# Patient Record
Sex: Female | Born: 1950 | Race: Black or African American | Hispanic: No | Marital: Married | Smoking: Never smoker
Health system: Southern US, Community
[De-identification: ages and names within clinical notes are randomized; demographics above are authoritative.]

## PROBLEM LIST (undated history)

## (undated) DIAGNOSIS — E119 Type 2 diabetes mellitus without complications: Secondary | ICD-10-CM

## (undated) DIAGNOSIS — J302 Other seasonal allergic rhinitis: Secondary | ICD-10-CM

## (undated) HISTORY — PX: TUBAL LIGATION: SHX77

## (undated) HISTORY — DX: Type 2 diabetes mellitus without complications: E11.9

---

## 2005-07-21 ENCOUNTER — Emergency Department: Payer: Self-pay | Admitting: Emergency Medicine

## 2005-07-22 ENCOUNTER — Other Ambulatory Visit: Payer: Self-pay

## 2005-09-11 ENCOUNTER — Ambulatory Visit: Payer: Self-pay | Admitting: Family Medicine

## 2006-01-07 ENCOUNTER — Ambulatory Visit: Payer: Self-pay | Admitting: Unknown Physician Specialty

## 2006-10-22 ENCOUNTER — Ambulatory Visit: Payer: Self-pay | Admitting: Family Medicine

## 2007-07-12 ENCOUNTER — Ambulatory Visit: Payer: Self-pay | Admitting: Family Medicine

## 2007-11-22 ENCOUNTER — Ambulatory Visit: Payer: Self-pay | Admitting: Family Medicine

## 2008-12-25 ENCOUNTER — Ambulatory Visit: Payer: Self-pay | Admitting: Family Medicine

## 2010-02-28 ENCOUNTER — Ambulatory Visit: Payer: Self-pay | Admitting: Family Medicine

## 2013-07-04 ENCOUNTER — Ambulatory Visit: Payer: Self-pay | Admitting: Nephrology

## 2013-08-23 DIAGNOSIS — N183 Chronic kidney disease, stage 3 unspecified: Secondary | ICD-10-CM | POA: Insufficient documentation

## 2013-08-23 DIAGNOSIS — I10 Essential (primary) hypertension: Secondary | ICD-10-CM | POA: Insufficient documentation

## 2013-08-23 DIAGNOSIS — E785 Hyperlipidemia, unspecified: Secondary | ICD-10-CM | POA: Insufficient documentation

## 2013-08-23 DIAGNOSIS — M109 Gout, unspecified: Secondary | ICD-10-CM | POA: Insufficient documentation

## 2013-08-23 DIAGNOSIS — E1122 Type 2 diabetes mellitus with diabetic chronic kidney disease: Secondary | ICD-10-CM | POA: Insufficient documentation

## 2015-02-08 DIAGNOSIS — R42 Dizziness and giddiness: Secondary | ICD-10-CM | POA: Insufficient documentation

## 2015-02-08 DIAGNOSIS — H9319 Tinnitus, unspecified ear: Secondary | ICD-10-CM | POA: Insufficient documentation

## 2015-02-08 DIAGNOSIS — M67441 Ganglion, right hand: Secondary | ICD-10-CM | POA: Insufficient documentation

## 2017-02-05 ENCOUNTER — Other Ambulatory Visit: Payer: Self-pay | Admitting: Family Medicine

## 2017-02-05 DIAGNOSIS — I739 Peripheral vascular disease, unspecified: Secondary | ICD-10-CM

## 2017-02-10 ENCOUNTER — Ambulatory Visit
Admission: RE | Admit: 2017-02-10 | Discharge: 2017-02-10 | Disposition: A | Discharge status: Home / Self Care | Payer: Medicare Other | Source: Ambulatory Visit | Attending: Family Medicine | Admitting: Family Medicine

## 2017-02-10 DIAGNOSIS — I739 Peripheral vascular disease, unspecified: Secondary | ICD-10-CM | POA: Diagnosis not present

## 2017-11-18 ENCOUNTER — Encounter (INDEPENDENT_AMBULATORY_CARE_PROVIDER_SITE_OTHER): Payer: Self-pay

## 2017-11-18 ENCOUNTER — Ambulatory Visit: Payer: Medicare Other | Admitting: Gastroenterology

## 2017-11-18 ENCOUNTER — Other Ambulatory Visit: Payer: Self-pay

## 2017-11-18 ENCOUNTER — Encounter: Payer: Self-pay | Admitting: Gastroenterology

## 2017-11-18 VITALS — BP 153/77 | HR 72 | Ht 66.0 in | Wt 188.0 lb

## 2017-11-18 DIAGNOSIS — K921 Melena: Secondary | ICD-10-CM

## 2017-11-18 DIAGNOSIS — Z800 Family history of malignant neoplasm of digestive organs: Secondary | ICD-10-CM

## 2017-11-18 NOTE — Progress Notes (Signed)
Gastroenterology Consultation  Referring Provider:     Sofie Hartigan, MD Primary Care Physician:  Madison Hartigan, MD Primary Gastroenterologist:  Dr. Allen Moore     Reason for Consultation:     Hematochezia        HPI:   Madison Moore is a 67 y.o. y/o female referred for consultation & management of hematochezia by Dr. Ellison Moore, Madison Noa, MD.  This patient comes in today after being seen in [facility] for rectal bleeding.  The patient states that she has intermittent bouts of constipation and reports that she had some bright red blood per rectum and went to [facility] ER.  The patient states she has not had any further bleeding since then.  The patient also reports that she has a brother who had colon cancer.  The patient's last colonoscopy was in 2007 and she has not had a repeat since.  There is no report of any unexplained weight loss fevers chills nausea or vomiting.  The patient also denies any change in bowel habits except for intermittent changes when she eats something that does not agree with her. The patient was reassured by her PCP that since it was one episode of bleeding and her blood count was reported to be good that she should wait to see me prior to having any procedures done in [place].  Past Medical History:  Diagnosis Date  . Diabetes [facility])     Past Surgical History:  Procedure Laterality Date  . CESAREAN SECTION    . TUBAL LIGATION      Prior to Admission medications   Medication Sig Start Date End Date Taking? Authorizing Provider  atorvastatin (LIPITOR) 20 MG tablet Take by mouth. 12/04/16 12/04/17 Yes [provider]  colchicine 0.6 MG tablet Take 2 tablets (1.2mg ) by mouth at first sign of gout flare followed by 1 tablet (0.6mg ) after 1 hour. (Max 1.8mg  within 1 hour) 11/24/16  Yes [provider]  fluticasone (FLONASE) 50 MCG/ACT nasal spray Place into the nose. 07/12/14  Yes [provider]  meclizine (ANTIVERT) 25 MG tablet   01/13/15  Yes [provider]  pioglitazone (ACTOS) 30 MG tablet TK 1 T PO ONCE D 08/21/17  Yes [provider]  valsartan-hydrochlorothiazide (DIOVAN-HCT) 160-25 MG tablet TK 1 T PO ONCE D 08/17/17  Yes [provider]    History reviewed. No pertinent family history.   Social History   Tobacco Use  . Smoking status: Never Smoker  . Smokeless tobacco: Never Used  Substance Use Topics  . Alcohol use: Never    Frequency: Never  . Drug use: Never    Allergies as of 11/18/2017  . (No Known Allergies)    Review of Systems:    All systems reviewed and negative except where noted in HPI.   Physical Exam:  BP (!) 153/77   Pulse 72   Ht 5\' 6"  (1.676 m)   Wt 188 lb (85.3 kg)   BMI 30.34 kg/m  No LMP recorded. General:   Alert,  Well-developed, well-nourished, pleasant and cooperative in NAD Head:  Normocephalic and atraumatic. Eyes:  Sclera clear, no icterus.   Conjunctiva pink. Ears:  Normal auditory acuity. Nose:  No deformity, discharge, or lesions. Mouth:  No deformity or lesions,oropharynx pink & moist. Neck:  Supple; no masses or thyromegaly. Lungs:  Respirations even and unlabored.  Clear throughout to auscultation.   No wheezes, crackles, or rhonchi. No acute distress. Heart:  Regular rate and  rhythm; no murmurs, clicks, rubs, or gallops. Abdomen:  Normal bowel sounds.  No bruits.  Soft, non-tender and non-distended without masses, hepatosplenomegaly or hernias noted.  No guarding or rebound tenderness.  Negative Carnett sign.   Rectal:  Deferred.  Msk:  Symmetrical without gross deformities.  Good, equal movement & strength bilaterally. Pulses:  Normal pulses noted. Extremities:  No clubbing or edema.  No cyanosis. Neurologic:  Alert and oriented x3;  grossly normal neurologically. Skin:  Intact without significant lesions or rashes.  No jaundice. Lymph Nodes:  No significant cervical adenopathy. Psych:  Alert and cooperative. Normal mood and  affect.  Imaging Studies: No results found.  Assessment and Plan:   Madison Moore is a 67 y.o. y/o female who comes in today after being in the ER at [facility] with rectal bleeding.  The patient has not had any further rectal bleeding although she states she does see some bright red blood per rectum when she is constipated.  The patient will be set up for a colonoscopy and has been told that since her brother had colon cancer she should have her colonoscopy every 5 years. I have discussed risks & benefits which include, but are not limited to, bleeding, infection, perforation & drug reaction.  The patient agrees with this plan & written consent will be obtained.     Madison Lame, MD. Marval Regal    Note: This dictation was prepared with Dragon dictation along with smaller phrase technology. Any transcriptional errors that result from this process are unintentional.

## 2017-11-25 NOTE — Discharge Instructions (Signed)
General Anesthesia, Adult, Care After °These instructions provide you with information about caring for yourself after your procedure. Your health care provider may also give you more specific instructions. Your treatment has been planned according to current medical practices, but problems sometimes occur. Call your health care provider if you have any problems or questions after your procedure. °What can I expect after the procedure? °After the procedure, it is common to have: °· Vomiting. °· A sore throat. °· Mental slowness. ° °It is common to feel: °· Nauseous. °· Cold or shivery. °· Sleepy. °· Tired. °· Sore or achy, even in parts of your body where you did not have surgery. ° °Follow these instructions at home: °For at least 24 hours after the procedure: °· Do not: °? Participate in activities where you could fall or become injured. °? Drive. °? Use heavy machinery. °? Drink alcohol. °? Take sleeping pills or medicines that cause drowsiness. °? Make important decisions or sign legal documents. °? Take care of children on your own. °· Rest. °Eating and drinking °· If you vomit, drink water, juice, or soup when you can drink without vomiting. °· Drink enough fluid to keep your urine clear or pale yellow. °· Make sure you have little or no nausea before eating solid foods. °· Follow the diet recommended by your health care provider. °General instructions °· Have a responsible adult stay with you until you are awake and alert. °· Return to your normal activities as told by your health care provider. Ask your health care provider what activities are safe for you. °· Take over-the-counter and prescription medicines only as told by your health care provider. °· If you smoke, do not smoke without supervision. °· Keep all follow-up visits as told by your health care provider. This is important. °Contact a health care provider if: °· You continue to have nausea or vomiting at home, and medicines are not helpful. °· You  cannot drink fluids or start eating again. °· You cannot urinate after 8-12 hours. °· You develop a skin rash. °· You have fever. °· You have increasing redness at the site of your procedure. °Get help right away if: °· You have difficulty breathing. °· You have chest pain. °· You have unexpected bleeding. °· You feel that you are having a life-threatening or urgent problem. °This information is not intended to replace advice given to you by your health care provider. Make sure you discuss any questions you have with your health care provider. °Document Released: 06/02/2000 Document Revised: 07/30/2015 Document Reviewed: 02/08/2015 °Elsevier Interactive Patient Education © 2018 [employer]. ° °

## 2017-11-26 ENCOUNTER — Ambulatory Visit
Admission: RE | Admit: 2017-11-26 | Discharge: 2017-11-26 | Disposition: A | Discharge status: Home / Self Care | Payer: Medicare Other | Source: Ambulatory Visit | Attending: Gastroenterology | Admitting: Gastroenterology

## 2017-11-26 ENCOUNTER — Ambulatory Visit: Payer: Medicare Other | Admitting: Anesthesiology

## 2017-11-26 ENCOUNTER — Encounter
Admission: RE | Disposition: A | Discharge status: Home / Self Care | Payer: Self-pay | Source: Ambulatory Visit | Attending: Gastroenterology

## 2017-11-26 DIAGNOSIS — D125 Benign neoplasm of sigmoid colon: Secondary | ICD-10-CM | POA: Diagnosis not present

## 2017-11-26 DIAGNOSIS — Z7951 Long term (current) use of inhaled steroids: Secondary | ICD-10-CM | POA: Insufficient documentation

## 2017-11-26 DIAGNOSIS — Z1211 Encounter for screening for malignant neoplasm of colon: Secondary | ICD-10-CM | POA: Diagnosis not present

## 2017-11-26 DIAGNOSIS — E119 Type 2 diabetes mellitus without complications: Secondary | ICD-10-CM | POA: Insufficient documentation

## 2017-11-26 DIAGNOSIS — K648 Other hemorrhoids: Secondary | ICD-10-CM | POA: Diagnosis not present

## 2017-11-26 DIAGNOSIS — K635 Polyp of colon: Secondary | ICD-10-CM

## 2017-11-26 DIAGNOSIS — Z800 Family history of malignant neoplasm of digestive organs: Secondary | ICD-10-CM | POA: Diagnosis not present

## 2017-11-26 DIAGNOSIS — I10 Essential (primary) hypertension: Secondary | ICD-10-CM | POA: Diagnosis not present

## 2017-11-26 HISTORY — PX: POLYPECTOMY: SHX5525

## 2017-11-26 HISTORY — DX: Other seasonal allergic rhinitis: J30.2

## 2017-11-26 HISTORY — PX: COLONOSCOPY WITH PROPOFOL: SHX5780

## 2017-11-26 LAB — GLUCOSE, CAPILLARY: GLUCOSE-CAPILLARY: 81 mg/dL (ref 70–99)

## 2017-11-26 SURGERY — COLONOSCOPY WITH PROPOFOL
Anesthesia: General | Site: Rectum

## 2017-11-26 MED ORDER — PROPOFOL 10 MG/ML IV BOLUS
INTRAVENOUS | Status: DC | PRN
  Administered 2017-11-26: 80 mg via INTRAVENOUS
  Administered 2017-11-26 (×4): 20 mg via INTRAVENOUS

## 2017-11-26 MED ORDER — LACTATED RINGERS IV SOLN: 1000.0000 mL | INTRAVENOUS | Status: DC

## 2017-11-26 MED ORDER — LACTATED RINGERS IV SOLN
INTRAVENOUS | Status: DC | PRN
  Administered 2017-11-26: 10:00:00 via INTRAVENOUS

## 2017-11-26 MED ORDER — LIDOCAINE HCL (CARDIAC) PF 100 MG/5ML IV SOSY
PREFILLED_SYRINGE | INTRAVENOUS | Status: DC | PRN
  Administered 2017-11-26: 80 mg via INTRAVENOUS

## 2017-11-26 SURGICAL SUPPLY — 6 items
CANISTER SUCT 1200ML W/VALVE (MISCELLANEOUS) ×3 IMPLANT
FORCEPS BIOP RAD 4 LRG CAP 4 (CUTTING FORCEPS) ×3 IMPLANT
GOWN CVR UNV OPN BCK APRN NK (MISCELLANEOUS) ×2 IMPLANT
GOWN ISOL THUMB LOOP REG UNIV (MISCELLANEOUS) ×4
KIT ENDO PROCEDURE OLY (KITS) ×3 IMPLANT
WATER STERILE IRR 250ML POUR (IV SOLUTION) ×3 IMPLANT

## 2017-11-26 NOTE — Anesthesia Preprocedure Evaluation (Addendum)
Anesthesia Evaluation  Patient identified by MRN, date of birth, ID band Patient awake    Reviewed: Allergy & Precautions, NPO status , Patient's Chart, lab work & pertinent test results, reviewed documented beta blocker date and time   Airway Mallampati: II  TM Distance: >3 FB Neck ROM: Full    Dental no notable dental hx.    Pulmonary neg pulmonary ROS,    Pulmonary exam normal breath sounds clear to auscultation       Cardiovascular hypertension, Normal cardiovascular exam Rhythm:Regular Rate:Normal     Neuro/Psych negative neurological ROS  negative psych ROS   GI/Hepatic Neg liver ROS, BRBPR   Endo/Other  diabetes  Renal/GU CRFRenal disease     Musculoskeletal negative musculoskeletal ROS (+)   Abdominal Normal abdominal exam  (+)   Peds  Hematology negative hematology ROS (+)   Anesthesia Other Findings   Reproductive/Obstetrics                            Anesthesia Physical Anesthesia Plan  ASA: II  Anesthesia Plan: General   Post-op Pain Management:    Induction: Intravenous  PONV Risk Score and Plan:   Airway Management Planned: Natural Airway  Additional Equipment: None  Intra-op Plan:   Post-operative Plan:   Informed Consent: I have reviewed the patients History and Physical, chart, labs and discussed the procedure including the risks, benefits and alternatives for the proposed anesthesia with the patient or authorized representative who has indicated his/her understanding and acceptance.     Plan Discussed with: CRNA, Anesthesiologist and Surgeon  Anesthesia Plan Comments:         Anesthesia Quick Evaluation

## 2017-11-26 NOTE — Transfer of Care (Signed)
Immediate Anesthesia Transfer of Care Note  Patient: Madison Moore  Procedure(s) Performed: COLONOSCOPY WITH PROPOFOL WITH BIOPSY (N/A Rectum) POLYPECTOMY (N/A Rectum)  Patient Location: PACU  Anesthesia Type: General  Level of Consciousness: awake, alert  and patient cooperative  Airway and Oxygen Therapy: Patient Spontanous Breathing and Patient connected to supplemental oxygen  Post-op Assessment: Post-op Vital signs reviewed, Patient's Cardiovascular Status Stable, Respiratory Function Stable, Patent Airway and No signs of Nausea or vomiting  Post-op Vital Signs: Reviewed and stable  Complications: No apparent anesthesia complications

## 2017-11-26 NOTE — Op Note (Addendum)
[facility] Gastroenterology Patient Name: Madison Moore Procedure Date: 11/26/2017 10:24 AM MRN: [id number] Account #: [id number] Date of Birth: Apr 12, 1950 Admit Type: Outpatient Age: 67 Room: [facility] OR ROOM 01 Gender: Female Note Status: Finalized Procedure:            Colonoscopy Indications:          Screening in patient at increased risk: Family history                        of 1st-degree relative with colorectal cancer Providers:            Lucilla Lame MD, MD Referring MD:         Sofie Hartigan (Referring MD) Medicines:            Propofol per Anesthesia Complications:        No immediate complications. Procedure:            Pre-Anesthesia Assessment:                       - Prior to the procedure, a History and Physical was                        performed, and patient medications and allergies were                        reviewed. The patient's tolerance of previous                        anesthesia was also reviewed. The risks and benefits of                        the procedure and the sedation options and risks were                        discussed with the patient. All questions were                        answered, and informed consent was obtained. Prior                        Anticoagulants: The patient has taken no previous                        anticoagulant or antiplatelet agents. ASA Grade                        Assessment: II - A patient with mild systemic disease.                        After reviewing the risks and benefits, the patient was                        deemed in satisfactory condition to undergo the                        procedure.                       After obtaining informed consent, the colonoscope was  passed under direct vision. Throughout the procedure,                        the patient's blood pressure, pulse, and oxygen                        saturations were monitored continuously. The                         Colonoscope was introduced through the anus and                        advanced to the the cecum, identified by appendiceal                        orifice and ileocecal valve. The colonoscopy was                        performed without difficulty. The patient tolerated the                        procedure well. The quality of the bowel preparation                        was excellent. Findings:      The perianal and digital rectal examinations were normal.      A 2 mm polyp was found in the sigmoid colon. The polyp was sessile. The       polyp was removed with a cold biopsy forceps. Resection and retrieval       were complete.      Non-bleeding internal hemorrhoids were found during retroflexion. The       hemorrhoids were Grade II (internal hemorrhoids that prolapse but reduce       spontaneously). Impression:           - One 2 mm polyp in the sigmoid colon, removed with a                        cold biopsy forceps. Resected and retrieved.                       - Non-bleeding internal hemorrhoids. Recommendation:       - Discharge patient to home.                       - Resume previous diet.                       - Continue present medications.                       - Await pathology results.                       - Repeat colonoscopy in 5 years for surveillance. Procedure Code(s):    --- Professional ---                       [phone], Colonoscopy, flexible; with biopsy, single or                        multiple Diagnosis Code(s):    ---  Professional ---                       Z80.0, Family history of malignant neoplasm of                        digestive organs                       D12.5, Benign neoplasm of sigmoid colon CPT copyright 2017 American Medical Association. All rights reserved. The codes documented in this report are preliminary and upon coder review may  be revised to meet current compliance requirements. Lucilla Lame MD, MD 11/26/2017 10:45:05 AM This  report has been signed electronically. Number of Addenda: 0 Note Initiated On: 11/26/2017 10:24 AM Scope Withdrawal Time: 0 hours 8 minutes 59 seconds  Total Procedure Duration: 0 hours 12 minutes 47 seconds       [facility]

## 2017-11-26 NOTE — H&P (Signed)
Lucilla Lame, MD [address] [place], [place] [address] Phone:[phone] Fax : [phone]  Primary Care Physician:  Sofie Hartigan, MD Primary Gastroenterologist:  Dr. Allen Norris  Pre-Procedure History & Physical: HPI:  Madison Moore is a 67 y.o. female is here for an colonoscopy.   Past Medical History:  Diagnosis Date  . Diabetes ([place])   . Seasonal allergies     Past Surgical History:  Procedure Laterality Date  . CESAREAN SECTION    . TUBAL LIGATION      Prior to Admission medications   Medication Sig Start Date End Date Taking? Authorizing Provider  atorvastatin (LIPITOR) 20 MG tablet Take by mouth. 12/04/16 12/04/17 Yes [provider]  colchicine 0.6 MG tablet Take 2 tablets (1.2mg ) by mouth at first sign of gout flare followed by 1 tablet (0.6mg ) after 1 hour. (Max 1.8mg  within 1 hour) 11/24/16  Yes [provider]  fluticasone (FLONASE) 50 MCG/ACT nasal spray Place into the nose. 07/12/14  Yes [provider]  pioglitazone (ACTOS) 30 MG tablet TK 1 T PO ONCE D 08/21/17  Yes [provider]  valsartan-hydrochlorothiazide (DIOVAN-HCT) 160-25 MG tablet TK 1 T PO ONCE D 08/17/17  Yes [provider]  vitamin B-12 (CYANOCOBALAMIN) 1000 MCG tablet Take 1,000 mcg by mouth every 30 (thirty) days.   Yes [provider]  meclizine (ANTIVERT) 25 MG tablet  01/13/15   [provider]    Allergies as of 11/18/2017  . (No Known Allergies)    History reviewed. No pertinent family history.  Social History   Socioeconomic History  . Marital status: Married    Spouse name: Not on file  . Number of children: Not on file  . Years of education: Not on file  . Highest education level: Not on file  Occupational History  . Not on file  Social Needs  . Financial resource strain: Not on file  . Food insecurity:    Worry: Not on file    Inability: Not on file  . Transportation needs:    Medical: Not on  file    Non-medical: Not on file  Tobacco Use  . Smoking status: Never Smoker  . Smokeless tobacco: Never Used  Substance and Sexual Activity  . Alcohol use: Never    Frequency: Never  . Drug use: Never  . Sexual activity: Not on file  Lifestyle  . Physical activity:    Days per week: Not on file    Minutes per session: Not on file  . Stress: Not on file  Relationships  . Social connections:    Talks on phone: Not on file    Gets together: Not on file    Attends religious service: Not on file    Active member of club or organization: Not on file    Attends meetings of clubs or organizations: Not on file    Relationship status: Not on file  . Intimate partner violence:    Fear of current or ex partner: Not on file    Emotionally abused: Not on file    Physically abused: Not on file    Forced sexual activity: Not on file  Other Topics Concern  . Not on file  Social History Narrative  . Not on file    Review of Systems: See HPI, otherwise negative ROS  Physical Exam: BP (!) 142/82   Pulse 82   Temp 97.7 F (36.5 C)   Ht 5\' 6"  (1.676 m)  Wt 84.8 kg   SpO2 100%   BMI 30.18 kg/m  General:   Alert,  pleasant and cooperative in NAD Head:  Normocephalic and atraumatic. Neck:  Supple; no masses or thyromegaly. Lungs:  Clear throughout to auscultation.    Heart:  Regular rate and rhythm. Abdomen:  Soft, nontender and nondistended. Normal bowel sounds, without guarding, and without rebound.   Neurologic:  Alert and  oriented x4;  grossly normal neurologically.  Impression/Plan: Madison Moore is here for an colonoscopy to be performed for family history of colon cancer in a brother  Risks, benefits, limitations, and alternatives regarding  colonoscopy have been reviewed with the patient.  Questions have been answered.  All parties agreeable.   Lucilla Lame, MD  11/26/2017, 9:51 AM

## 2017-11-26 NOTE — Anesthesia Postprocedure Evaluation (Signed)
Anesthesia Post Note  Patient: Madison Moore  Procedure(s) Performed: COLONOSCOPY WITH PROPOFOL WITH BIOPSY (N/A Rectum) POLYPECTOMY (N/A Rectum)  Patient location during evaluation: PACU Anesthesia Type: General Level of consciousness: awake Pain management: pain level controlled Vital Signs Assessment: post-procedure vital signs reviewed and stable Respiratory status: spontaneous breathing Cardiovascular status: blood pressure returned to baseline Postop Assessment: no headache Anesthetic complications: no    Lavonna Monarch

## 2017-11-26 NOTE — Anesthesia Procedure Notes (Signed)
Procedure Name: MAC Date/Time: 11/26/2017 10:30 AM Performed by: Lind Guest, CRNA Pre-anesthesia Checklist: Patient identified, Emergency Drugs available, Suction available and Timeout performed Patient Re-evaluated:Patient Re-evaluated prior to induction Oxygen Delivery Method: Nasal cannula

## 2017-11-27 ENCOUNTER — Encounter: Payer: Self-pay | Admitting: Gastroenterology

## 2017-11-30 ENCOUNTER — Encounter: Payer: Self-pay | Admitting: Gastroenterology

## 2017-12-01 ENCOUNTER — Encounter: Payer: Self-pay | Admitting: Gastroenterology

## 2018-05-12 ENCOUNTER — Ambulatory Visit
Admission: EM | Admit: 2018-05-12 | Discharge: 2018-05-12 | Disposition: A | Discharge status: Home / Self Care | Payer: Medicare Other | Attending: Family Medicine | Admitting: Family Medicine

## 2018-05-12 ENCOUNTER — Ambulatory Visit (INDEPENDENT_AMBULATORY_CARE_PROVIDER_SITE_OTHER): Payer: Medicare Other

## 2018-05-12 ENCOUNTER — Other Ambulatory Visit: Payer: Self-pay

## 2018-05-12 DIAGNOSIS — M79645 Pain in left finger(s): Secondary | ICD-10-CM

## 2018-05-12 DIAGNOSIS — Z8739 Personal history of other diseases of the musculoskeletal system and connective tissue: Secondary | ICD-10-CM | POA: Diagnosis not present

## 2018-05-12 MED ORDER — PREDNISONE 10 MG PO TABS: ORAL_TABLET | ORAL | 0 refills | Status: AC

## 2018-05-12 NOTE — ED Triage Notes (Signed)
Patient complains of finger pain in left thumb. Patient states that she has swelling below nail that started roughly one week ago.

## 2018-05-12 NOTE — Discharge Instructions (Signed)
Medication as prescribed.  Rest, ice, elevation.  If persists, see Ortho ([facility]).  Dr. Lacinda Axon

## 2018-05-12 NOTE — ED Provider Notes (Signed)
[facility]    CSN: [id number] Arrival date & time: 05/12/18  9937  History   Chief Complaint Chief Complaint  Patient presents with  . Hand Pain   HPI  68 year old female presents with left thumb pain.  Patient reports that she has had left thumb pain and swelling for the past 2 weeks.  She states that it is moderately painful.  Worse with palpation.  She does not recall any fall, trauma, injury.  States that she has a history of gout.  She has taken colchicine without improvement.  She reports associated swelling which is located at the IP joint.  Worse with activity.  No relieving factors.  No other complaints.  PMH, Surgical Hx, Family Hx, Social History reviewed and updated as below.  PMH: Patient Active Problem List   Diagnosis Date Noted  . Family history of malignant neoplasm of gastrointestinal tract   . Polyp of sigmoid colon   . Dizzinesses 02/08/2015  . Ganglion cyst of finger of right hand 02/08/2015  . Tinnitus 02/08/2015  . Gout 08/23/2013  . Hyperlipidemia 08/23/2013  . Hypertension, essential, benign 08/23/2013  . Type 2 diabetes mellitus with stage 3 chronic kidney disease ([place]) 08/23/2013   Past Surgical History:  Procedure Laterality Date  . CESAREAN SECTION    . COLONOSCOPY WITH PROPOFOL N/A 11/26/2017   Procedure: COLONOSCOPY WITH PROPOFOL WITH BIOPSY;  Surgeon: Lucilla Lame, MD;  Location: [facility];  Service: Endoscopy;  Laterality: N/A;  . POLYPECTOMY N/A 11/26/2017   Procedure: POLYPECTOMY;  Surgeon: Lucilla Lame, MD;  Location: [facility];  Service: Endoscopy;  Laterality: N/A;  . TUBAL LIGATION      OB History   No obstetric history on file.      Home Medications    Prior to Admission medications   Medication Sig Start Date End Date Taking? Authorizing Provider  atorvastatin (LIPITOR) 20 MG tablet Take by mouth. 12/04/16 05/12/18 Yes [provider]  meclizine (ANTIVERT) 25 MG tablet  01/13/15  Yes  [provider]  pioglitazone (ACTOS) 30 MG tablet TK 1 T PO ONCE D 08/21/17  Yes [provider]  valsartan-hydrochlorothiazide (DIOVAN-HCT) 160-25 MG tablet TK 1 T PO ONCE D 08/17/17  Yes [provider]  predniSONE (DELTASONE) 10 MG tablet 50 mg daily x 2 days, then 40 mg daily x 2 days, then 30 mg daily x 2 days, then 20 mg daily x 2 days, then 10 mg daily x 2 days. 05/12/18   Coral Spikes, DO   Social History Social History   Tobacco Use  . Smoking status: Never Smoker  . Smokeless tobacco: Never Used  Substance Use Topics  . Alcohol use: Never    Frequency: Never  . Drug use: Never     Allergies   Patient has no known allergies.   Review of Systems Review of Systems  Constitutional: Negative.   Musculoskeletal:       Left thumb pain, swelling.   Physical Exam Triage Vital Signs ED Triage Vitals  Enc Vitals Group     BP 05/12/18 0953 (!) 141/84     Pulse Rate 05/12/18 0953 84     Resp 05/12/18 0953 16     Temp 05/12/18 0953 97.7 F (36.5 C)     Temp Source 05/12/18 0953 Oral     SpO2 05/12/18 0953 100 %     Weight 05/12/18 0951 180 lb (81.6 kg)     Height 05/12/18 0951 5\' 6"  (  1.676 m)     Head Circumference --      Peak Flow --      Pain Score 05/12/18 0951 6     Pain Loc --      Pain Edu? --      Excl. in [place]? --    Updated Vital Signs BP (!) 141/84 (BP Location: Left Arm)   Pulse 84   Temp 97.7 F (36.5 C) (Oral)   Resp 16   Ht 5\' 6"  (1.676 m)   Wt 81.6 kg   SpO2 100%   BMI 29.05 kg/m   Visual Acuity Right Eye Distance:   Left Eye Distance:   Bilateral Distance:    Right Eye Near:   Left Eye Near:    Bilateral Near:     Physical Exam Vitals signs and nursing note reviewed.  Constitutional:      General: She is not in acute distress.    Appearance: Normal appearance.  HENT:     Head: Normocephalic and atraumatic.  Eyes:     General: No scleral icterus.    Conjunctiva/sclera: Conjunctivae normal.  Pulmonary:      Effort: Pulmonary effort is normal. No respiratory distress.  Musculoskeletal:     Comments: Left thumb -swelling noted at the IP joint.  Tender to palpation.  Decreased range of motion.  Skin:    General: Skin is warm.     Findings: No bruising or rash.  Neurological:     Mental Status: She is alert.  Psychiatric:        Mood and Affect: Mood normal.        Behavior: Behavior normal.    UC Treaments / Results  Labs (all labs ordered are listed, but only abnormal results are displayed) Labs Reviewed - No data to display  EKG None  Radiology Dg Finger Thumb Left  Result Date: 05/12/2018 CLINICAL DATA:  Pain, swelling at IP joint EXAM: LEFT THUMB 2+V COMPARISON:  None. FINDINGS: Early spurring in the MCP joint. Soft tissue swelling around the IP joint of the left thumb. No fracture, subluxation or dislocation. Joint space maintained. IMPRESSION: Soft tissue swelling around the left thumb IP joint without acute bony abnormality. Electronically Signed   By: Rolm Baptise M.D.   On: 05/12/2018 10:32    Procedures Procedures (including critical care time)  Medications Ordered in UC Medications - No data to display  Initial Impression / Assessment and Plan / UC Course  I have reviewed the triage vital signs and the nursing notes.  Pertinent labs & imaging results that were available during my care of the patient were reviewed by me and considered in my medical decision making (see chart for details).    68 year old female presents with pain and swelling of the IP joint of the left thumb.  X-ray read as negative.  I feel like there are degenerative changes there.  Patient concerned that she may have gout.  Patient recently took colchicine without significant improvement.  Doubt gout given location.  Placing on brief course of steroids.  If fails to improve or worsens, should see orthopedics.  Final Clinical Impressions(s) / UC Diagnoses   Final diagnoses:  Pain of left thumb      Discharge Instructions     Medication as prescribed.  Rest, ice, elevation.  If persists, see Ortho ([facility]).  Dr. Lacinda Axon     ED Prescriptions    Medication Sig Dispense Auth. Provider   predniSONE (DELTASONE) 10 MG tablet 50  mg daily x 2 days, then 40 mg daily x 2 days, then 30 mg daily x 2 days, then 20 mg daily x 2 days, then 10 mg daily x 2 days. 30 tablet Coral Spikes, DO     Controlled Substance Prescriptions [place] Controlled Substance Registry consulted? Not Applicable   Coral Spikes, DO 05/12/18 1043

## 2019-06-02 ENCOUNTER — Other Ambulatory Visit: Payer: Self-pay | Admitting: Family Medicine

## 2019-06-02 DIAGNOSIS — Z1231 Encounter for screening mammogram for malignant neoplasm of breast: Secondary | ICD-10-CM

## 2020-04-05 ENCOUNTER — Encounter: Payer: Self-pay | Admitting: Physician Assistant

## 2020-04-05 ENCOUNTER — Emergency Department
Admission: EM | Admit: 2020-04-05 | Discharge: 2020-04-05 | Disposition: A | Discharge status: Home / Self Care | Payer: Medicare PPO | Attending: Emergency Medicine | Admitting: Emergency Medicine

## 2020-04-05 ENCOUNTER — Emergency Department: Payer: Medicare PPO

## 2020-04-05 DIAGNOSIS — W010XXA Fall on same level from slipping, tripping and stumbling without subsequent striking against object, initial encounter: Secondary | ICD-10-CM | POA: Insufficient documentation

## 2020-04-05 DIAGNOSIS — E1122 Type 2 diabetes mellitus with diabetic chronic kidney disease: Secondary | ICD-10-CM | POA: Diagnosis not present

## 2020-04-05 DIAGNOSIS — I129 Hypertensive chronic kidney disease with stage 1 through stage 4 chronic kidney disease, or unspecified chronic kidney disease: Secondary | ICD-10-CM | POA: Insufficient documentation

## 2020-04-05 DIAGNOSIS — N183 Chronic kidney disease, stage 3 unspecified: Secondary | ICD-10-CM | POA: Insufficient documentation

## 2020-04-05 DIAGNOSIS — S8392XA Sprain of unspecified site of left knee, initial encounter: Secondary | ICD-10-CM | POA: Insufficient documentation

## 2020-04-05 DIAGNOSIS — Z79899 Other long term (current) drug therapy: Secondary | ICD-10-CM | POA: Insufficient documentation

## 2020-04-05 DIAGNOSIS — S8992XA Unspecified injury of left lower leg, initial encounter: Secondary | ICD-10-CM | POA: Diagnosis present

## 2020-04-05 MED ORDER — CYCLOBENZAPRINE HCL 10 MG PO TABS
5.0000 mg | ORAL_TABLET | Freq: Once | ORAL | Status: AC
  Administered 2020-04-05: 5 mg via ORAL
  Filled 2020-04-05: qty 1

## 2020-04-05 MED ORDER — CYCLOBENZAPRINE HCL 5 MG PO TABS: 5.0000 mg | ORAL_TABLET | Freq: Three times a day (TID) | ORAL | 0 refills | Status: AC | PRN

## 2020-04-05 NOTE — ED Triage Notes (Signed)
Pt here with a fall after washing her car. Pt states that she hit her hip but is c/o pain in her left knee. Pt in NAD in triage.

## 2020-04-05 NOTE — Discharge Instructions (Signed)
Take OTC Tylenol as needed. Take the muscle relaxant as needed.  Res, ice, and elevate the knee when seated. Follow-up with Ortho as discussed.

## 2020-04-05 NOTE — ED Triage Notes (Signed)
Pt comes via EMS with c/o mechanical fall. Pt got back up and then heard her knee pop and fell to ground. Pt denies any current pain per EMS, VSS

## 2020-04-05 NOTE — ED Provider Notes (Signed)
MSE was initiated and I personally evaluated the patient and placed orders (if any) at  4:49 PM on April 05, 2020.  Mechanical fall today slipping on ice. Instability felt of left knee. Had pop when tried to ambulate/bear weight. Did not hit head. No head, neck or back pain. Will initiate imaging, patient otherwise stable.   The patient appears stable so that the remainder of the MSE may be completed by another provider.   Marlana Salvage, PA 04/05/20 1650    Blake Divine, MD 04/05/20 [phone]

## 2020-04-17 NOTE — ED Provider Notes (Signed)
[facility] Emergency Department Provider Note ____________________________________________  Time seen: 1801  I have reviewed the triage vital signs and the nursing notes.  HISTORY  Chief Complaint  Fall  HPI Madison Moore is a 70 y.o. female presents to the ED via EMS from home, following a mechanical fall.  Patient was immediately at a local car wash, did not wash her car, but [place]  She states that her hip was hit on the way down, but complains primarily of pain and disability to the left knee.  She reports when she got up on her own power, she had an immediate palpable and audible pop to her left knee, which caused her to fall to the ground again.  She denies any head injury, loss of consciousness, nausea, vomiting, dizziness, or weakness.   Past Medical History:  Diagnosis Date  . Diabetes ([place])   . Seasonal allergies     Patient Active Problem List   Diagnosis Date Noted  . Family history of malignant neoplasm of gastrointestinal tract   . Polyp of sigmoid colon   . Dizzinesses 02/08/2015  . Ganglion cyst of finger of right hand 02/08/2015  . Tinnitus 02/08/2015  . Gout 08/23/2013  . Hyperlipidemia 08/23/2013  . Hypertension, essential, benign 08/23/2013  . Type 2 diabetes mellitus with stage 3 chronic kidney disease ([place]) 08/23/2013    Past Surgical History:  Procedure Laterality Date  . CESAREAN SECTION    . COLONOSCOPY WITH PROPOFOL N/A 11/26/2017   Procedure: COLONOSCOPY WITH PROPOFOL WITH BIOPSY;  Surgeon: Lucilla Lame, MD;  Location: [facility];  Service: Endoscopy;  Laterality: N/A;  . POLYPECTOMY N/A 11/26/2017   Procedure: POLYPECTOMY;  Surgeon: Lucilla Lame, MD;  Location: [facility];  Service: Endoscopy;  Laterality: N/A;  . TUBAL LIGATION      Prior to Admission medications   Medication Sig Start Date End Date Taking? Authorizing Provider  cyclobenzaprine (FLEXERIL) 5 MG  tablet Take 1 tablet (5 mg total) by mouth 3 (three) times daily as needed. 04/05/20  Yes Shavonta Gossen, Dannielle Karvonen, PA-C  atorvastatin (LIPITOR) 20 MG tablet Take by mouth. 12/04/16 05/12/18  [provider]  meclizine (ANTIVERT) 25 MG tablet  01/13/15   [provider]  pioglitazone (ACTOS) 30 MG tablet TK 1 T PO ONCE D 08/21/17   [provider]  predniSONE (DELTASONE) 10 MG tablet 50 mg daily x 2 days, then 40 mg daily x 2 days, then 30 mg daily x 2 days, then 20 mg daily x 2 days, then 10 mg daily x 2 days. 05/12/18   Cook, Barnie Del, DO  valsartan-hydrochlorothiazide (DIOVAN-HCT) 160-25 MG tablet TK 1 T PO ONCE D 08/17/17   [provider]    Allergies Patient has no known allergies.  History reviewed. No pertinent family history.  Social History Social History   Tobacco Use  . Smoking status: Never Smoker  . Smokeless tobacco: Never Used  Vaping Use  . Vaping Use: Never used  Substance Use Topics  . Alcohol use: Never  . Drug use: Never    Review of Systems  Constitutional: Negative for fever. Eyes: Negative for visual changes. ENT: Negative for sore throat. Cardiovascular: Negative for chest pain. Respiratory: Negative for shortness of breath. Gastrointestinal: Negative for abdominal pain, vomiting and diarrhea. Genitourinary: Negative for dysuria. Musculoskeletal: Negative for back pain. Left knee pain & disability. Right hip contusion Skin: Negative for rash. Neurological:  Negative for headaches, focal weakness or numbness. ____________________________________________  PHYSICAL EXAM:  VITAL SIGNS: ED Triage Vitals  Enc Vitals Group     BP 04/05/20 1636 (!) 149/73     Pulse Rate 04/05/20 1636 (!) 107     Resp 04/05/20 1636 18     Temp 04/05/20 1636 98.9 F (37.2 C)     Temp Source 04/05/20 1636 Oral     SpO2 04/05/20 1636 100 %     Weight 04/05/20 1636 180 lb (81.6 kg)     Height 04/05/20 1636 5\' 6"  (1.676 m)     Head Circumference  --      Peak Flow --      Pain Score 04/05/20 1649 3     Pain Loc --      Pain Edu? --      Excl. in [place]? --     Constitutional: Alert and oriented. Well appearing and in no distress. Head: Normocephalic and atraumatic. Eyes: Conjunctivae are normal. Normal extraocular movements Cardiovascular: Normal rate, regular rhythm. Normal distal pulses. Respiratory: Normal respiratory effort. No wheezes/rales/rhonchi. Gastrointestinal: Soft and nontender. No distention. Musculoskeletal: Normal spinal alignment without midline tenderness, spasm, vomiting, or step-off.  Patient with normal active range of motion to the right hip.  Left knee without obvious deformity or dislocation.  Patient able to demonstrate lumbar flexion and extension range in supine position.  No significant valgus or varus joint stress is appreciated.  Patella tracking is normal without ballottement or dislocation.  No popliteal space fullness is appreciated.  No Q tenderness noted distally.  Patient is able to transition from supine to sit without assistance.  When she is attempts to transition from sit to stand, the patient is immediately struck with an audible and palpable pop to the left knee, causing immediate giveaway.  She is able to maintain her balance, and lowers her self to the bed without difficulty.  Nontender with normal range of motion in all extremities.  Neurologic: Normal speech and language. No gross focal neurologic deficits are appreciated. Skin:  Skin is warm, dry and intact. No rash noted. ____________________________________________   RADIOLOGY  DG Left Knee  IMPRESSION: Negative.  I, Melvenia Needles, personally viewed and evaluated these images (plain radiographs) as part of my medical decision making, as well as reviewing the written report by the radiologist. ____________________________________________  PROCEDURES  Knee immobilizer Walker Flexeril 5 mg  PO  Procedures ____________________________________________  INITIAL IMPRESSION / ASSESSMENT AND PLAN / ED COURSE  Patient ED evaluation of injury sustained following a slip and fall on icy patch.  Patient presents primarily with pain and difficulty to the left knee.  No obvious deformity or dislocation is appreciated.  Knee is also negative for any acute fracture or dislocation.  Knee exam is more consistent with an acute knee strain without internal derangement.  She is placed in a knee immobilizer and given a walker for weightbearing as tolerated.  She is referred to Ortho for ongoing symptom management.  Return precautions have been discussed.  A prescription for Flexeril was also provided.   Madison Moore was evaluated in Emergency Department on 04/17/2020 for the symptoms described in the history of present illness. She was evaluated in the context of the global COVID-19 pandemic, which necessitated consideration that the patient might be at risk for infection with the SARS-CoV-2 virus that causes COVID-19. Institutional protocols and algorithms that pertain to the evaluation of patients at risk for COVID-19 are in a state of  rapid change based on information released by regulatory bodies including the CDC and federal and state organizations. These policies and algorithms were followed during the patient's care in the ED. ____________________________________________  FINAL CLINICAL IMPRESSION(S) / ED DIAGNOSES  Final diagnoses:  Sprain of left knee, unspecified ligament, initial encounter      Melvenia Needles, PA-C 04/17/20 1615    Naaman Plummer, MD 04/19/20 1315

## 2021-08-21 IMAGING — DX DG KNEE COMPLETE 4+V*L*
4 series · 4 of 4 positions shown · non-contrast
Comparison: None.

CLINICAL DATA: Left knee pain, fall

EXAM:
LEFT KNEE - COMPLETE 4+ VIEW

[knee ap]
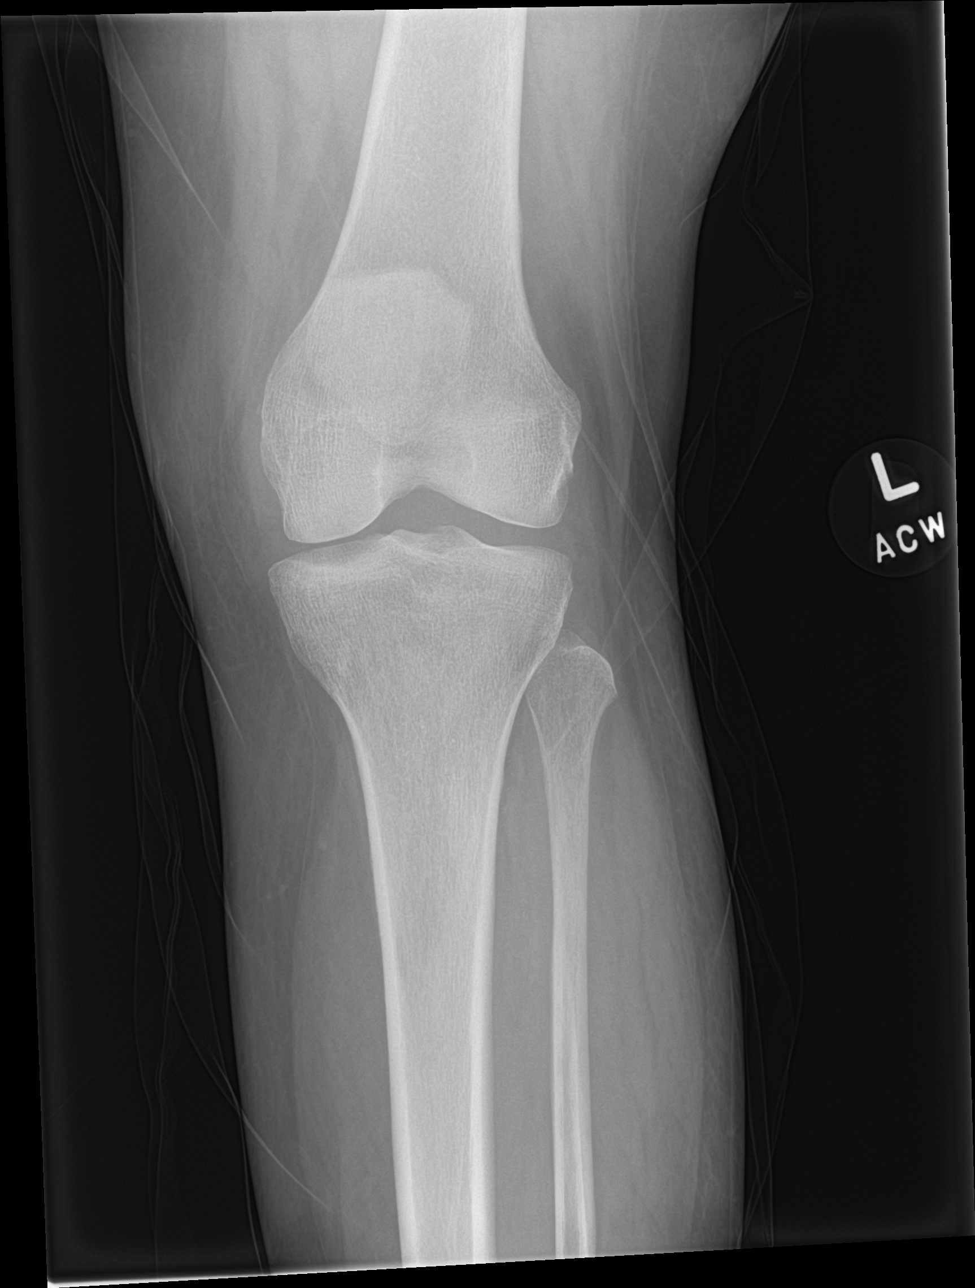

[knee lat]
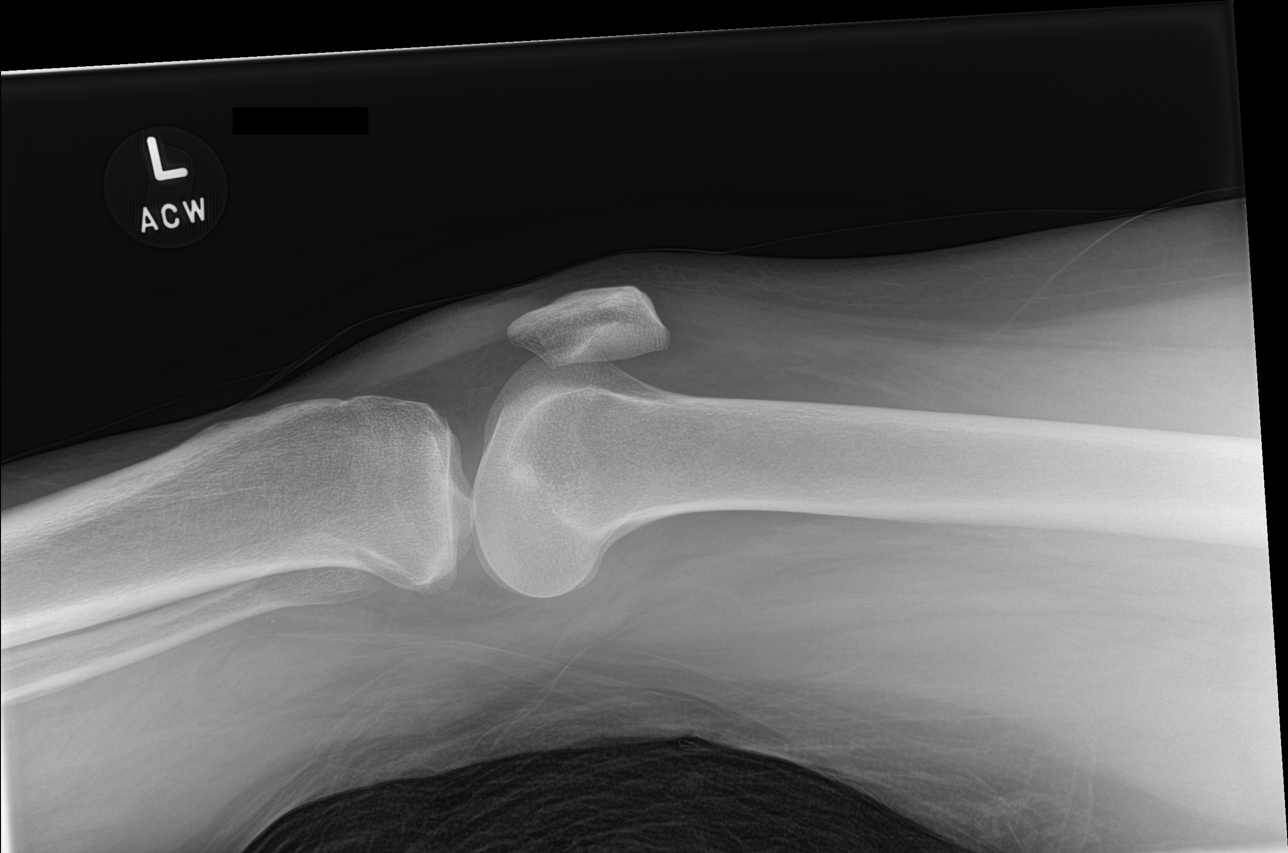

[knee obl (1 of 2)]
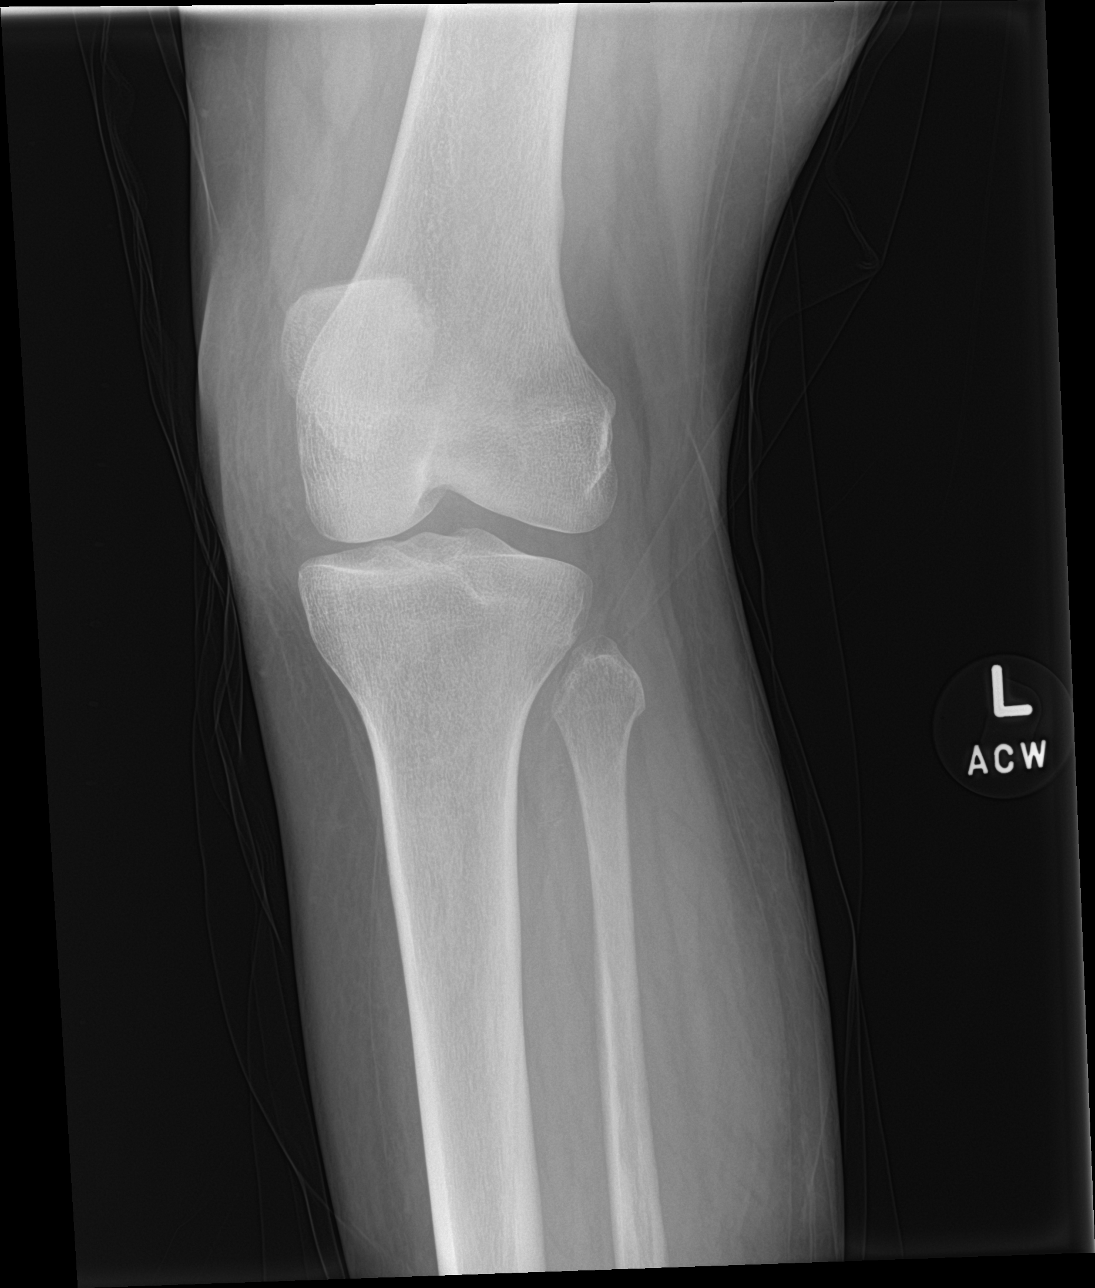

[knee obl (2 of 2)]
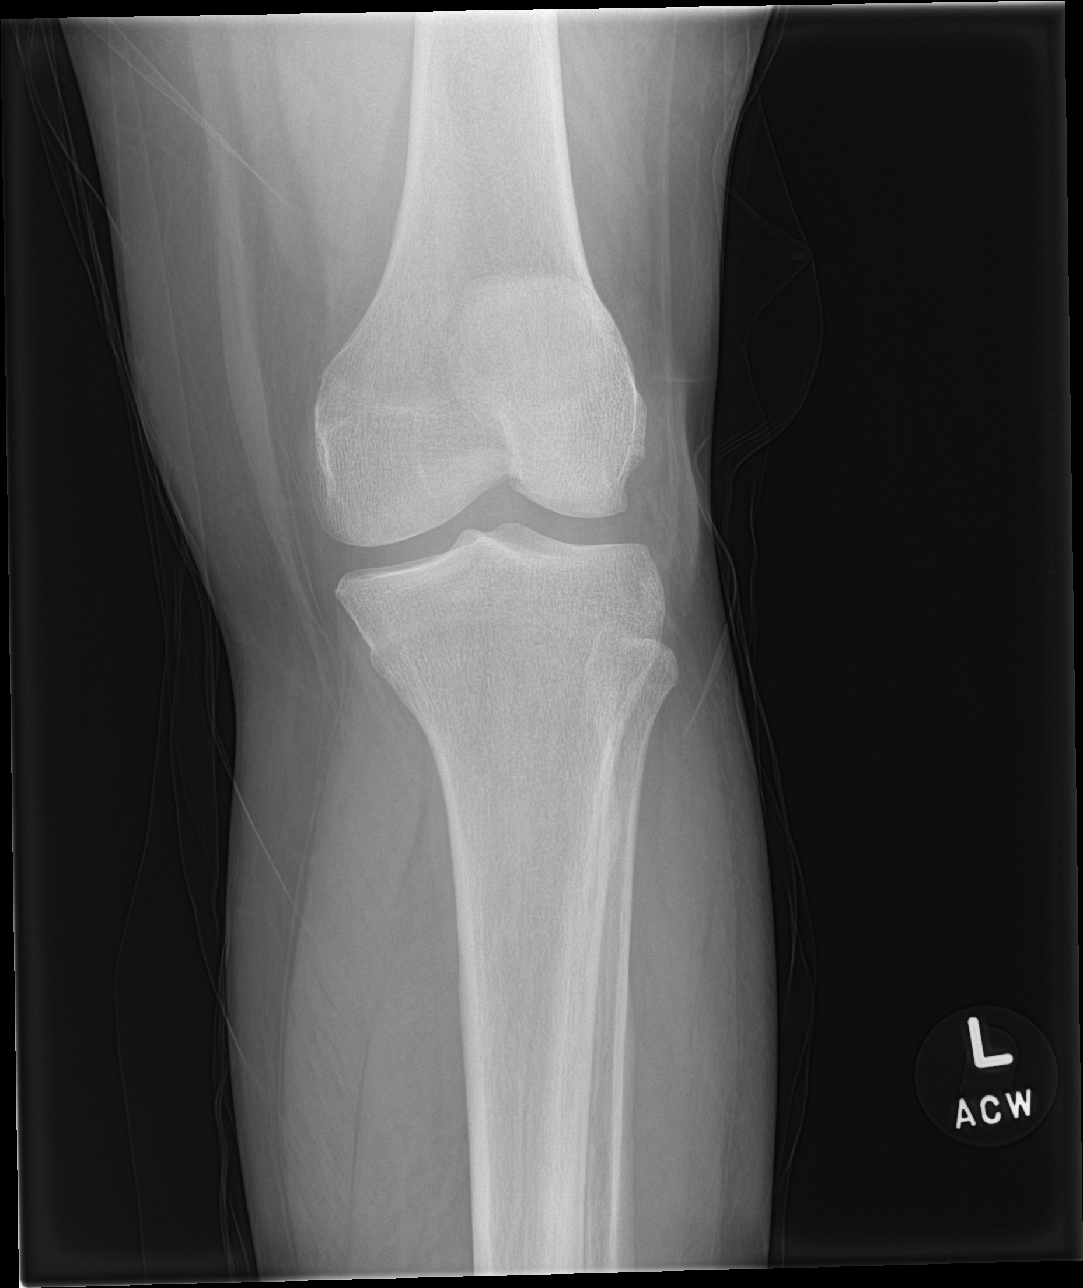

[4 of 4 positions shown; findings below may reference images not displayed]

FINDINGS: No evidence of fracture, dislocation, or joint effusion. No evidence
of arthropathy or other focal bone abnormality. Soft tissues are
unremarkable.
IMPRESSION: Negative.

## 2021-10-05 DIAGNOSIS — Z609 Problem related to social environment, unspecified: Secondary | ICD-10-CM | POA: Diagnosis not present

## 2021-10-05 DIAGNOSIS — N189 Chronic kidney disease, unspecified: Secondary | ICD-10-CM | POA: Diagnosis not present

## 2021-10-05 DIAGNOSIS — Z20822 Contact with and (suspected) exposure to covid-19: Secondary | ICD-10-CM | POA: Diagnosis not present

## 2021-10-05 DIAGNOSIS — M7989 Other specified soft tissue disorders: Secondary | ICD-10-CM | POA: Diagnosis not present

## 2021-10-05 DIAGNOSIS — Z7984 Long term (current) use of oral hypoglycemic drugs: Secondary | ICD-10-CM | POA: Diagnosis not present

## 2021-10-05 DIAGNOSIS — M25511 Pain in right shoulder: Secondary | ICD-10-CM | POA: Diagnosis not present

## 2021-10-05 DIAGNOSIS — M109 Gout, unspecified: Secondary | ICD-10-CM | POA: Diagnosis not present

## 2021-10-05 DIAGNOSIS — I129 Hypertensive chronic kidney disease with stage 1 through stage 4 chronic kidney disease, or unspecified chronic kidney disease: Secondary | ICD-10-CM | POA: Diagnosis not present

## 2021-10-05 DIAGNOSIS — M79644 Pain in right finger(s): Secondary | ICD-10-CM | POA: Diagnosis not present

## 2021-10-05 DIAGNOSIS — E785 Hyperlipidemia, unspecified: Secondary | ICD-10-CM | POA: Diagnosis not present

## 2021-10-05 DIAGNOSIS — E1122 Type 2 diabetes mellitus with diabetic chronic kidney disease: Secondary | ICD-10-CM | POA: Diagnosis not present

## 2021-10-23 DIAGNOSIS — R202 Paresthesia of skin: Secondary | ICD-10-CM | POA: Diagnosis not present

## 2021-10-29 DIAGNOSIS — M792 Neuralgia and neuritis, unspecified: Secondary | ICD-10-CM | POA: Diagnosis not present

## 2021-10-29 DIAGNOSIS — Z8739 Personal history of other diseases of the musculoskeletal system and connective tissue: Secondary | ICD-10-CM | POA: Diagnosis not present

## 2021-10-29 DIAGNOSIS — M216X2 Other acquired deformities of left foot: Secondary | ICD-10-CM | POA: Diagnosis not present

## 2021-10-29 DIAGNOSIS — M216X1 Other acquired deformities of right foot: Secondary | ICD-10-CM | POA: Diagnosis not present

## 2021-10-29 DIAGNOSIS — E1142 Type 2 diabetes mellitus with diabetic polyneuropathy: Secondary | ICD-10-CM | POA: Diagnosis not present

## 2021-11-06 DIAGNOSIS — R202 Paresthesia of skin: Secondary | ICD-10-CM | POA: Diagnosis not present

## 2021-11-06 DIAGNOSIS — R200 Anesthesia of skin: Secondary | ICD-10-CM | POA: Diagnosis not present

## 2021-11-06 DIAGNOSIS — R208 Other disturbances of skin sensation: Secondary | ICD-10-CM | POA: Diagnosis not present

## 2021-11-06 DIAGNOSIS — G629 Polyneuropathy, unspecified: Secondary | ICD-10-CM | POA: Diagnosis not present

## 2021-11-06 DIAGNOSIS — G479 Sleep disorder, unspecified: Secondary | ICD-10-CM | POA: Diagnosis not present

## 2022-01-08 DIAGNOSIS — E1142 Type 2 diabetes mellitus with diabetic polyneuropathy: Secondary | ICD-10-CM | POA: Diagnosis not present

## 2022-01-08 DIAGNOSIS — M1A00X0 Idiopathic chronic gout, unspecified site, without tophus (tophi): Secondary | ICD-10-CM | POA: Diagnosis not present

## 2022-01-08 DIAGNOSIS — G4733 Obstructive sleep apnea (adult) (pediatric): Secondary | ICD-10-CM | POA: Diagnosis not present

## 2022-01-08 DIAGNOSIS — N1832 Chronic kidney disease, stage 3b: Secondary | ICD-10-CM | POA: Diagnosis not present

## 2022-01-08 DIAGNOSIS — I129 Hypertensive chronic kidney disease with stage 1 through stage 4 chronic kidney disease, or unspecified chronic kidney disease: Secondary | ICD-10-CM | POA: Diagnosis not present

## 2022-01-08 DIAGNOSIS — E7800 Pure hypercholesterolemia, unspecified: Secondary | ICD-10-CM | POA: Diagnosis not present

## 2022-01-08 DIAGNOSIS — E1122 Type 2 diabetes mellitus with diabetic chronic kidney disease: Secondary | ICD-10-CM | POA: Diagnosis not present

## 2022-01-23 DIAGNOSIS — R200 Anesthesia of skin: Secondary | ICD-10-CM | POA: Diagnosis not present

## 2022-01-23 DIAGNOSIS — R202 Paresthesia of skin: Secondary | ICD-10-CM | POA: Diagnosis not present

## 2022-02-06 DIAGNOSIS — M722 Plantar fascial fibromatosis: Secondary | ICD-10-CM | POA: Diagnosis not present

## 2022-06-07 DIAGNOSIS — E1142 Type 2 diabetes mellitus with diabetic polyneuropathy: Secondary | ICD-10-CM | POA: Diagnosis not present

## 2022-06-07 DIAGNOSIS — E669 Obesity, unspecified: Secondary | ICD-10-CM | POA: Diagnosis not present

## 2022-06-07 DIAGNOSIS — E785 Hyperlipidemia, unspecified: Secondary | ICD-10-CM | POA: Diagnosis not present

## 2022-06-07 DIAGNOSIS — E1122 Type 2 diabetes mellitus with diabetic chronic kidney disease: Secondary | ICD-10-CM | POA: Diagnosis not present

## 2022-06-07 DIAGNOSIS — I129 Hypertensive chronic kidney disease with stage 1 through stage 4 chronic kidney disease, or unspecified chronic kidney disease: Secondary | ICD-10-CM | POA: Diagnosis not present

## 2022-06-07 DIAGNOSIS — R32 Unspecified urinary incontinence: Secondary | ICD-10-CM | POA: Diagnosis not present

## 2022-06-07 DIAGNOSIS — M1990 Unspecified osteoarthritis, unspecified site: Secondary | ICD-10-CM | POA: Diagnosis not present

## 2022-06-07 DIAGNOSIS — N189 Chronic kidney disease, unspecified: Secondary | ICD-10-CM | POA: Diagnosis not present

## 2022-06-07 DIAGNOSIS — G4733 Obstructive sleep apnea (adult) (pediatric): Secondary | ICD-10-CM | POA: Diagnosis not present

## 2022-08-12 DIAGNOSIS — E7800 Pure hypercholesterolemia, unspecified: Secondary | ICD-10-CM | POA: Diagnosis not present

## 2022-08-12 DIAGNOSIS — I129 Hypertensive chronic kidney disease with stage 1 through stage 4 chronic kidney disease, or unspecified chronic kidney disease: Secondary | ICD-10-CM | POA: Diagnosis not present

## 2022-08-12 DIAGNOSIS — Z0000 Encounter for general adult medical examination without abnormal findings: Secondary | ICD-10-CM | POA: Diagnosis not present

## 2022-08-12 DIAGNOSIS — E1122 Type 2 diabetes mellitus with diabetic chronic kidney disease: Secondary | ICD-10-CM | POA: Diagnosis not present

## 2022-08-12 DIAGNOSIS — G4733 Obstructive sleep apnea (adult) (pediatric): Secondary | ICD-10-CM | POA: Diagnosis not present

## 2022-08-12 DIAGNOSIS — E1142 Type 2 diabetes mellitus with diabetic polyneuropathy: Secondary | ICD-10-CM | POA: Diagnosis not present

## 2022-08-12 DIAGNOSIS — L84 Corns and callosities: Secondary | ICD-10-CM | POA: Diagnosis not present

## 2022-08-12 DIAGNOSIS — N1832 Chronic kidney disease, stage 3b: Secondary | ICD-10-CM | POA: Diagnosis not present

## 2022-08-12 DIAGNOSIS — Z1331 Encounter for screening for depression: Secondary | ICD-10-CM | POA: Diagnosis not present

## 2022-08-12 DIAGNOSIS — M1A00X0 Idiopathic chronic gout, unspecified site, without tophus (tophi): Secondary | ICD-10-CM | POA: Diagnosis not present

## 2022-09-09 DIAGNOSIS — M79672 Pain in left foot: Secondary | ICD-10-CM | POA: Diagnosis not present

## 2022-09-09 DIAGNOSIS — N189 Chronic kidney disease, unspecified: Secondary | ICD-10-CM | POA: Diagnosis not present

## 2022-09-09 DIAGNOSIS — E119 Type 2 diabetes mellitus without complications: Secondary | ICD-10-CM | POA: Diagnosis not present

## 2022-09-23 DIAGNOSIS — E119 Type 2 diabetes mellitus without complications: Secondary | ICD-10-CM | POA: Diagnosis not present

## 2022-09-23 DIAGNOSIS — M79672 Pain in left foot: Secondary | ICD-10-CM | POA: Diagnosis not present

## 2022-09-23 DIAGNOSIS — N189 Chronic kidney disease, unspecified: Secondary | ICD-10-CM | POA: Diagnosis not present

## 2022-10-07 DIAGNOSIS — M216X1 Other acquired deformities of right foot: Secondary | ICD-10-CM | POA: Diagnosis not present

## 2022-10-07 DIAGNOSIS — E1142 Type 2 diabetes mellitus with diabetic polyneuropathy: Secondary | ICD-10-CM | POA: Diagnosis not present

## 2022-10-07 DIAGNOSIS — M216X2 Other acquired deformities of left foot: Secondary | ICD-10-CM | POA: Diagnosis not present

## 2022-10-07 DIAGNOSIS — Z8739 Personal history of other diseases of the musculoskeletal system and connective tissue: Secondary | ICD-10-CM | POA: Diagnosis not present

## 2022-10-07 DIAGNOSIS — M79672 Pain in left foot: Secondary | ICD-10-CM | POA: Diagnosis not present

## 2022-10-07 DIAGNOSIS — G629 Polyneuropathy, unspecified: Secondary | ICD-10-CM | POA: Diagnosis not present

## 2022-10-21 DIAGNOSIS — M216X1 Other acquired deformities of right foot: Secondary | ICD-10-CM | POA: Diagnosis not present

## 2022-10-21 DIAGNOSIS — M109 Gout, unspecified: Secondary | ICD-10-CM | POA: Diagnosis not present

## 2022-10-21 DIAGNOSIS — E1142 Type 2 diabetes mellitus with diabetic polyneuropathy: Secondary | ICD-10-CM | POA: Diagnosis not present

## 2022-10-21 DIAGNOSIS — M216X2 Other acquired deformities of left foot: Secondary | ICD-10-CM | POA: Diagnosis not present

## 2022-10-21 DIAGNOSIS — G629 Polyneuropathy, unspecified: Secondary | ICD-10-CM | POA: Diagnosis not present

## 2022-12-26 DIAGNOSIS — E1122 Type 2 diabetes mellitus with diabetic chronic kidney disease: Secondary | ICD-10-CM | POA: Diagnosis not present

## 2023-02-26 DIAGNOSIS — G4733 Obstructive sleep apnea (adult) (pediatric): Secondary | ICD-10-CM | POA: Diagnosis not present

## 2023-02-26 DIAGNOSIS — E1122 Type 2 diabetes mellitus with diabetic chronic kidney disease: Secondary | ICD-10-CM | POA: Diagnosis not present

## 2023-02-26 DIAGNOSIS — N184 Chronic kidney disease, stage 4 (severe): Secondary | ICD-10-CM | POA: Diagnosis not present

## 2023-02-26 DIAGNOSIS — E1142 Type 2 diabetes mellitus with diabetic polyneuropathy: Secondary | ICD-10-CM | POA: Diagnosis not present

## 2023-02-26 DIAGNOSIS — E7800 Pure hypercholesterolemia, unspecified: Secondary | ICD-10-CM | POA: Diagnosis not present

## 2023-02-26 DIAGNOSIS — M1A00X0 Idiopathic chronic gout, unspecified site, without tophus (tophi): Secondary | ICD-10-CM | POA: Diagnosis not present

## 2023-02-26 DIAGNOSIS — I129 Hypertensive chronic kidney disease with stage 1 through stage 4 chronic kidney disease, or unspecified chronic kidney disease: Secondary | ICD-10-CM | POA: Diagnosis not present

## 2023-05-19 DIAGNOSIS — E1142 Type 2 diabetes mellitus with diabetic polyneuropathy: Secondary | ICD-10-CM | POA: Diagnosis not present

## 2023-05-19 DIAGNOSIS — I10 Essential (primary) hypertension: Secondary | ICD-10-CM | POA: Diagnosis not present

## 2023-05-19 DIAGNOSIS — M791 Myalgia, unspecified site: Secondary | ICD-10-CM | POA: Diagnosis not present

## 2023-05-19 DIAGNOSIS — E1122 Type 2 diabetes mellitus with diabetic chronic kidney disease: Secondary | ICD-10-CM | POA: Diagnosis not present

## 2023-05-19 DIAGNOSIS — E7800 Pure hypercholesterolemia, unspecified: Secondary | ICD-10-CM | POA: Diagnosis not present

## 2023-05-19 DIAGNOSIS — N184 Chronic kidney disease, stage 4 (severe): Secondary | ICD-10-CM | POA: Diagnosis not present

## 2023-10-27 DIAGNOSIS — Z1331 Encounter for screening for depression: Secondary | ICD-10-CM | POA: Diagnosis not present

## 2023-10-27 DIAGNOSIS — E1142 Type 2 diabetes mellitus with diabetic polyneuropathy: Secondary | ICD-10-CM | POA: Diagnosis not present

## 2023-10-27 DIAGNOSIS — I129 Hypertensive chronic kidney disease with stage 1 through stage 4 chronic kidney disease, or unspecified chronic kidney disease: Secondary | ICD-10-CM | POA: Diagnosis not present

## 2023-10-27 DIAGNOSIS — M1A00X0 Idiopathic chronic gout, unspecified site, without tophus (tophi): Secondary | ICD-10-CM | POA: Diagnosis not present

## 2023-10-27 DIAGNOSIS — G4733 Obstructive sleep apnea (adult) (pediatric): Secondary | ICD-10-CM | POA: Diagnosis not present

## 2023-10-27 DIAGNOSIS — E7800 Pure hypercholesterolemia, unspecified: Secondary | ICD-10-CM | POA: Diagnosis not present

## 2023-10-27 DIAGNOSIS — N184 Chronic kidney disease, stage 4 (severe): Secondary | ICD-10-CM | POA: Diagnosis not present

## 2023-10-27 DIAGNOSIS — E1122 Type 2 diabetes mellitus with diabetic chronic kidney disease: Secondary | ICD-10-CM | POA: Diagnosis not present

## 2023-10-27 DIAGNOSIS — Z0000 Encounter for general adult medical examination without abnormal findings: Secondary | ICD-10-CM | POA: Diagnosis not present

## 2024-01-09 DIAGNOSIS — E119 Type 2 diabetes mellitus without complications: Secondary | ICD-10-CM | POA: Diagnosis not present

## 2024-01-09 DIAGNOSIS — H524 Presbyopia: Secondary | ICD-10-CM | POA: Diagnosis not present

## 2024-01-09 DIAGNOSIS — H2513 Age-related nuclear cataract, bilateral: Secondary | ICD-10-CM | POA: Diagnosis not present

## 2024-01-09 DIAGNOSIS — H25013 Cortical age-related cataract, bilateral: Secondary | ICD-10-CM | POA: Diagnosis not present

## 2024-01-22 DIAGNOSIS — J019 Acute sinusitis, unspecified: Secondary | ICD-10-CM | POA: Diagnosis not present

## 2024-01-22 DIAGNOSIS — B9689 Other specified bacterial agents as the cause of diseases classified elsewhere: Secondary | ICD-10-CM | POA: Diagnosis not present
